# Patient Record
Sex: Female | Born: 1980 | Hispanic: No | Marital: Married | Smoking: Former smoker
Health system: Southern US, Community
[De-identification: ages and names within clinical notes are randomized; demographics above are authoritative.]

## PROBLEM LIST (undated history)

## (undated) DIAGNOSIS — G43909 Migraine, unspecified, not intractable, without status migrainosus: Secondary | ICD-10-CM

## (undated) DIAGNOSIS — G479 Sleep disorder, unspecified: Secondary | ICD-10-CM

## (undated) DIAGNOSIS — S129XXA Fracture of neck, unspecified, initial encounter: Secondary | ICD-10-CM

## (undated) HISTORY — PX: CERVICAL SPINE SURGERY: SHX589

## (undated) HISTORY — DX: Migraine, unspecified, not intractable, without status migrainosus: G43.909

## (undated) HISTORY — PX: TONSILLECTOMY: SUR1361

## (undated) HISTORY — DX: Sleep disorder, unspecified: G47.9

## (undated) HISTORY — PX: WISDOM TOOTH EXTRACTION: SHX21

---
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
## 2005-03-28 ENCOUNTER — Inpatient Hospital Stay: Payer: Self-pay

## 2010-05-13 DIAGNOSIS — S129XXA Fracture of neck, unspecified, initial encounter: Secondary | ICD-10-CM

## 2010-05-13 HISTORY — DX: Fracture of neck, unspecified, initial encounter: S12.9XXA

## 2012-04-03 ENCOUNTER — Other Ambulatory Visit: Payer: Self-pay | Admitting: Neurosurgery

## 2012-04-03 ENCOUNTER — Encounter: Payer: Self-pay | Admitting: Physical Medicine & Rehabilitation

## 2012-04-03 DIAGNOSIS — M542 Cervicalgia: Secondary | ICD-10-CM

## 2012-04-03 DIAGNOSIS — M5410 Radiculopathy, site unspecified: Secondary | ICD-10-CM

## 2012-04-08 ENCOUNTER — Other Ambulatory Visit: Payer: Self-pay | Admitting: Neurosurgery

## 2012-04-08 ENCOUNTER — Ambulatory Visit
Admission: RE | Admit: 2012-04-08 | Discharge: 2012-04-08 | Disposition: A | Discharge status: Home / Self Care | Payer: Self-pay | Source: Ambulatory Visit | Attending: Neurosurgery | Admitting: Neurosurgery

## 2012-04-08 ENCOUNTER — Inpatient Hospital Stay
Admission: RE | Admit: 2012-04-08 | Discharge: 2012-04-08 | Disposition: A | Discharge status: Home / Self Care | Payer: Self-pay | Source: Ambulatory Visit | Attending: Neurosurgery | Admitting: Neurosurgery

## 2012-04-08 VITALS — BP 109/66 | HR 68 | Ht 67.0 in | Wt 140.0 lb

## 2012-04-08 DIAGNOSIS — M542 Cervicalgia: Secondary | ICD-10-CM

## 2012-04-08 DIAGNOSIS — M5410 Radiculopathy, site unspecified: Secondary | ICD-10-CM

## 2012-04-08 MED ORDER — MEPERIDINE HCL 100 MG/ML IJ SOLN
75.0000 mg | Freq: Once | INTRAMUSCULAR | Status: AC
  Administered 2012-04-08: 75 mg via INTRAMUSCULAR

## 2012-04-08 MED ORDER — ONDANSETRON HCL 4 MG/2ML IJ SOLN
4.0000 mg | Freq: Once | INTRAMUSCULAR | Status: AC
  Administered 2012-04-08: 4 mg via INTRAMUSCULAR

## 2012-04-08 MED ORDER — DIAZEPAM 5 MG PO TABS
5.0000 mg | ORAL_TABLET | Freq: Once | ORAL | Status: AC
  Administered 2012-04-08: 5 mg via ORAL

## 2012-04-08 MED ORDER — IOHEXOL 300 MG/ML  SOLN
10.0000 mL | Freq: Once | INTRAMUSCULAR | Status: AC | PRN
  Administered 2012-04-08: 10 mL via INTRATHECAL

## 2012-04-08 NOTE — Progress Notes (Signed)
Pt states she has been off ultram since Sunday. Discharge instructions explained.  dd

## 2012-04-10 ENCOUNTER — Ambulatory Visit
Admission: RE | Admit: 2012-04-10 | Discharge: 2012-04-10 | Disposition: A | Discharge status: Home / Self Care | Payer: BC Managed Care – PPO | Source: Ambulatory Visit | Attending: Neurosurgery | Admitting: Neurosurgery

## 2012-04-10 ENCOUNTER — Other Ambulatory Visit: Payer: Self-pay | Admitting: Neurosurgery

## 2012-04-10 DIAGNOSIS — G971 Other reaction to spinal and lumbar puncture: Secondary | ICD-10-CM

## 2012-04-10 MED ORDER — IOHEXOL 180 MG/ML  SOLN
1.0000 mL | Freq: Once | INTRAMUSCULAR | Status: AC | PRN
  Administered 2012-04-10: 1 mL via EPIDURAL

## 2012-04-10 NOTE — Progress Notes (Signed)
20cc blood drawn from left AC space without difficulty for epidural blood patch.  jkl 

## 2012-04-17 ENCOUNTER — Ambulatory Visit (HOSPITAL_BASED_OUTPATIENT_CLINIC_OR_DEPARTMENT_OTHER): Payer: BC Managed Care – PPO | Admitting: Physical Medicine & Rehabilitation

## 2012-04-17 ENCOUNTER — Encounter: Payer: Self-pay | Admitting: Physical Medicine & Rehabilitation

## 2012-04-17 ENCOUNTER — Encounter: Payer: BC Managed Care – PPO | Attending: Physical Medicine & Rehabilitation

## 2012-04-17 VITALS — BP 115/62 | HR 91 | Resp 14 | Ht 67.0 in | Wt 141.0 lb

## 2012-04-17 DIAGNOSIS — M25529 Pain in unspecified elbow: Secondary | ICD-10-CM | POA: Insufficient documentation

## 2012-04-17 DIAGNOSIS — M542 Cervicalgia: Secondary | ICD-10-CM | POA: Insufficient documentation

## 2012-04-17 DIAGNOSIS — R209 Unspecified disturbances of skin sensation: Secondary | ICD-10-CM

## 2012-04-17 DIAGNOSIS — M25539 Pain in unspecified wrist: Secondary | ICD-10-CM | POA: Insufficient documentation

## 2012-04-17 NOTE — Progress Notes (Signed)
EMG performed 04/17/2012.  See EMG report under media tab.

## 2012-04-17 NOTE — Patient Instructions (Signed)
No evidence of carpal tunnel syndrome No evidence of ulnar neuropathy No electrodiagnostic evidence of cervical radiculopathy affecting the motor nerves

## 2012-05-08 ENCOUNTER — Ambulatory Visit: Payer: BC Managed Care – PPO | Admitting: Physical Medicine & Rehabilitation

## 2016-09-23 DIAGNOSIS — M542 Cervicalgia: Secondary | ICD-10-CM | POA: Diagnosis not present

## 2016-09-23 DIAGNOSIS — M5412 Radiculopathy, cervical region: Secondary | ICD-10-CM | POA: Diagnosis not present

## 2016-09-23 DIAGNOSIS — R51 Headache: Secondary | ICD-10-CM | POA: Diagnosis not present

## 2017-03-17 DIAGNOSIS — R51 Headache: Secondary | ICD-10-CM | POA: Diagnosis not present

## 2017-03-17 DIAGNOSIS — M5412 Radiculopathy, cervical region: Secondary | ICD-10-CM | POA: Diagnosis not present

## 2017-03-17 DIAGNOSIS — Z6828 Body mass index (BMI) 28.0-28.9, adult: Secondary | ICD-10-CM | POA: Diagnosis not present

## 2017-03-17 DIAGNOSIS — M542 Cervicalgia: Secondary | ICD-10-CM | POA: Diagnosis not present

## 2017-04-24 DIAGNOSIS — Z23 Encounter for immunization: Secondary | ICD-10-CM | POA: Diagnosis not present

## 2017-05-12 DIAGNOSIS — Z0000 Encounter for general adult medical examination without abnormal findings: Secondary | ICD-10-CM | POA: Diagnosis not present

## 2017-09-29 DIAGNOSIS — Z6828 Body mass index (BMI) 28.0-28.9, adult: Secondary | ICD-10-CM | POA: Diagnosis not present

## 2017-09-29 DIAGNOSIS — R51 Headache: Secondary | ICD-10-CM | POA: Diagnosis not present

## 2017-09-29 DIAGNOSIS — M542 Cervicalgia: Secondary | ICD-10-CM | POA: Diagnosis not present

## 2017-09-29 DIAGNOSIS — M5412 Radiculopathy, cervical region: Secondary | ICD-10-CM | POA: Diagnosis not present

## 2018-03-23 DIAGNOSIS — Z23 Encounter for immunization: Secondary | ICD-10-CM | POA: Diagnosis not present

## 2018-03-30 DIAGNOSIS — R51 Headache: Secondary | ICD-10-CM | POA: Diagnosis not present

## 2018-03-30 DIAGNOSIS — Z6826 Body mass index (BMI) 26.0-26.9, adult: Secondary | ICD-10-CM | POA: Diagnosis not present

## 2018-03-30 DIAGNOSIS — M5412 Radiculopathy, cervical region: Secondary | ICD-10-CM | POA: Diagnosis not present

## 2018-03-30 DIAGNOSIS — M542 Cervicalgia: Secondary | ICD-10-CM | POA: Diagnosis not present

## 2018-05-15 DIAGNOSIS — G479 Sleep disorder, unspecified: Secondary | ICD-10-CM | POA: Diagnosis not present

## 2018-05-15 DIAGNOSIS — Z0000 Encounter for general adult medical examination without abnormal findings: Secondary | ICD-10-CM | POA: Diagnosis not present

## 2018-09-17 DIAGNOSIS — R51 Headache: Secondary | ICD-10-CM | POA: Diagnosis not present

## 2018-09-17 DIAGNOSIS — M542 Cervicalgia: Secondary | ICD-10-CM | POA: Diagnosis not present

## 2018-10-30 ENCOUNTER — Telehealth: Payer: Self-pay | Admitting: Certified Nurse Midwife

## 2018-10-30 NOTE — Telephone Encounter (Signed)
Patent coming in on 01/15/19 at 9:50 to see [facility] for mirena removal and reinsertion.   Thanks

## 2018-11-02 NOTE — Telephone Encounter (Signed)
Noted. Will order to arrive by apt date/time. 

## 2019-01-15 ENCOUNTER — Other Ambulatory Visit: Payer: Self-pay

## 2019-01-15 ENCOUNTER — Encounter: Payer: Self-pay | Admitting: Certified Nurse Midwife

## 2019-01-15 ENCOUNTER — Ambulatory Visit (INDEPENDENT_AMBULATORY_CARE_PROVIDER_SITE_OTHER): Payer: BC Managed Care – PPO | Admitting: Certified Nurse Midwife

## 2019-01-15 VITALS — BP 102/84 | HR 84 | Ht 67.75 in | Wt 169.0 lb

## 2019-01-15 DIAGNOSIS — Z23 Encounter for immunization: Secondary | ICD-10-CM

## 2019-01-15 DIAGNOSIS — Z30433 Encounter for removal and reinsertion of intrauterine contraceptive device: Secondary | ICD-10-CM

## 2019-01-17 ENCOUNTER — Encounter: Payer: Self-pay | Admitting: Certified Nurse Midwife

## 2019-01-17 DIAGNOSIS — G43909 Migraine, unspecified, not intractable, without status migrainosus: Secondary | ICD-10-CM | POA: Insufficient documentation

## 2019-01-17 DIAGNOSIS — G479 Sleep disorder, unspecified: Secondary | ICD-10-CM | POA: Insufficient documentation

## 2019-01-17 MED ORDER — LEVONORGESTREL 20 MCG/24HR IU IUD: INTRAUTERINE_SYSTEM | Freq: Once | INTRAUTERINE | Status: DC
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
## 2019-02-17 ENCOUNTER — Encounter: Payer: Self-pay | Admitting: Certified Nurse Midwife

## 2019-02-17 ENCOUNTER — Ambulatory Visit (INDEPENDENT_AMBULATORY_CARE_PROVIDER_SITE_OTHER): Payer: BC Managed Care – PPO | Admitting: Certified Nurse Midwife

## 2019-02-17 ENCOUNTER — Other Ambulatory Visit: Payer: Self-pay

## 2019-02-17 ENCOUNTER — Other Ambulatory Visit (HOSPITAL_COMMUNITY)
Admission: RE | Admit: 2019-02-17 | Discharge: 2019-02-17 | Disposition: A | Discharge status: Home / Self Care | Payer: BC Managed Care – PPO | Source: Ambulatory Visit | Attending: Certified Nurse Midwife | Admitting: Certified Nurse Midwife

## 2019-02-17 VITALS — BP 100/70 | HR 71 | Ht 67.0 in | Wt 179.0 lb

## 2019-02-17 DIAGNOSIS — Z124 Encounter for screening for malignant neoplasm of cervix: Secondary | ICD-10-CM | POA: Insufficient documentation

## 2019-02-17 DIAGNOSIS — Z30431 Encounter for routine checking of intrauterine contraceptive device: Secondary | ICD-10-CM

## 2019-02-17 DIAGNOSIS — Z01419 Encounter for gynecological examination (general) (routine) without abnormal findings: Secondary | ICD-10-CM

## 2019-02-17 DIAGNOSIS — Z1239 Encounter for other screening for malignant neoplasm of breast: Secondary | ICD-10-CM
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
## 2019-03-01 LAB — CYTOLOGY - PAP
Comment: NEGATIVE
Diagnosis: NEGATIVE
High risk HPV: NEGATIVE

## 2019-03-15 DIAGNOSIS — M542 Cervicalgia: Secondary | ICD-10-CM | POA: Diagnosis not present

## 2019-03-15 DIAGNOSIS — Z6827 Body mass index (BMI) 27.0-27.9, adult: Secondary | ICD-10-CM | POA: Diagnosis not present

## 2019-03-15 DIAGNOSIS — M5412 Radiculopathy, cervical region: Secondary | ICD-10-CM | POA: Diagnosis not present

## 2019-03-15 DIAGNOSIS — R519 Headache, unspecified: Secondary | ICD-10-CM | POA: Diagnosis not present

## 2019-11-24 ENCOUNTER — Ambulatory Visit (INDEPENDENT_AMBULATORY_CARE_PROVIDER_SITE_OTHER): Payer: BC Managed Care – PPO | Admitting: Certified Nurse Midwife

## 2019-11-24 ENCOUNTER — Encounter: Payer: Self-pay | Admitting: Certified Nurse Midwife

## 2019-11-24 ENCOUNTER — Other Ambulatory Visit: Payer: Self-pay

## 2019-11-24 VITALS — BP 114/70 | HR 72 | Resp 18 | Ht 67.0 in | Wt 166.0 lb

## 2019-11-24 DIAGNOSIS — Z30432 Encounter for removal of intrauterine contraceptive device: Secondary | ICD-10-CM | POA: Diagnosis not present

## 2019-11-24 DIAGNOSIS — Z3169 Encounter for other general counseling and advice on procreation: Secondary | ICD-10-CM | POA: Diagnosis not present

## 2019-11-24 NOTE — Progress Notes (Signed)
  History of Present Illness:  Crystal Moreno is a 39 y.o. that had a Mirena IUD replaced approximately 10 months ago. Since that time, she states that she and her husband have decided to have another baby. She has stopped taking all medication except for fish oil, tumeric, melatonin and prenatal vitamins.    Patient Active Problem List   Diagnosis Date Noted  . Migraines 01/17/2019  . Sleep disorder 01/17/2019   Medications:  Current Outpatient Medications on File Prior to Visit  Medication Sig Dispense Refill  . Omega-3 Fatty Acids (FISH OIL) 1000 MG CAPS Take by mouth.    . Turmeric (QC TUMERIC COMPLEX PO) Take by mouth.     Current Facility-Administered Medications on File Prior to Visit  Medication Dose Route Frequency Provider Last Rate Last Admin  . levonorgestrel (MIRENA) 20 MCG/24HR IUD   Intrauterine Once Farrel Conners, CNM       Allergies: is allergic to lyrica [pregabalin] and codeine.  Physical Exam:  BP 114/70   Pulse 72   Resp 18   Ht 5\' 7"  (1.702 m)   Wt 166 lb (75.3 kg)   SpO2 99%   BMI 26.00 kg/m  Body mass index is 26 kg/m. Constitutional: Well nourished, well developed female in no acute distress. Neuro: Grossly intact Psych:  Normal mood and affect.    Pelvic exam:  Two IUD strings present seen coming from the cervical os. EGBUS, vaginal vault and cervix: within normal limits  IUD Removal Strings of IUD identified and grasped.  IUD removed without problem.  Pt tolerated this well.  IUD noted to be intact.  Assessment: IUD Removal  Plan: IUD removed and plan for contraception is no method as she desires to conceive. Discussed increased risks of chromosomal anomalies, preeclampsia and GDM with AMA Continue prenatal vitamins Seek help from a fertility specialist if not pregnant in the next 6 months. She was amenable to this  , CNM 11/24/2019 9:24 AM

## 2019-11-28 ENCOUNTER — Encounter: Payer: Self-pay | Admitting: Certified Nurse Midwife

## 2020-01-04 ENCOUNTER — Encounter: Payer: Self-pay | Admitting: Obstetrics and Gynecology

## 2020-01-04 ENCOUNTER — Ambulatory Visit (INDEPENDENT_AMBULATORY_CARE_PROVIDER_SITE_OTHER): Payer: BC Managed Care – PPO | Admitting: Obstetrics and Gynecology

## 2020-01-04 ENCOUNTER — Other Ambulatory Visit: Payer: Self-pay

## 2020-01-04 VITALS — BP 110/80 | Ht 68.0 in | Wt 165.0 lb

## 2020-01-04 DIAGNOSIS — R102 Pelvic and perineal pain: Secondary | ICD-10-CM

## 2020-01-04 DIAGNOSIS — N926 Irregular menstruation, unspecified: Secondary | ICD-10-CM | POA: Diagnosis not present

## 2020-01-04 NOTE — Patient Instructions (Signed)
I value your feedback and entrusting us with your care. If you get a [facility] patient survey, I would appreciate you taking the time to let us know about your experience today. Thank you!  As of April 22, 2019, your lab results will be released to your MyChart immediately, before I even have a chance to see them. Please give me time to review them and contact you if there are any abnormalities. Thank you for your patience.  
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
## 2020-02-20 ENCOUNTER — Encounter (HOSPITAL_COMMUNITY): Payer: Self-pay | Admitting: Emergency Medicine

## 2020-02-20 ENCOUNTER — Emergency Department (HOSPITAL_COMMUNITY): Payer: BC Managed Care – PPO

## 2020-02-20 ENCOUNTER — Other Ambulatory Visit: Payer: Self-pay

## 2020-02-20 DIAGNOSIS — Z87891 Personal history of nicotine dependence: Secondary | ICD-10-CM | POA: Diagnosis not present

## 2020-02-20 DIAGNOSIS — S93401A Sprain of unspecified ligament of right ankle, initial encounter: Secondary | ICD-10-CM | POA: Diagnosis not present

## 2020-02-20 DIAGNOSIS — W108XXA Fall (on) (from) other stairs and steps, initial encounter: Secondary | ICD-10-CM | POA: Insufficient documentation

## 2020-02-20 DIAGNOSIS — S99911A Unspecified injury of right ankle, initial encounter: Secondary | ICD-10-CM | POA: Diagnosis present

## 2020-02-20 LAB — POCT PREGNANCY, URINE: Preg Test, Ur: NEGATIVE

## 2020-02-20 NOTE — ED Triage Notes (Signed)
Pt c/o right ankle pain after falling tonight.

## 2020-02-21 ENCOUNTER — Emergency Department (HOSPITAL_COMMUNITY)
Admission: EM | Admit: 2020-02-21 | Discharge: 2020-02-21 | Disposition: A | Discharge status: Home / Self Care | Payer: BC Managed Care – PPO | Attending: Emergency Medicine | Admitting: Emergency Medicine

## 2020-02-21 DIAGNOSIS — S93401A Sprain of unspecified ligament of right ankle, initial encounter: Secondary | ICD-10-CM
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
## 2020-02-21 NOTE — Discharge Instructions (Addendum)
Apply ice for 30 minutes at a time, 4 times a day.  Keep your foot elevated as much as possible.  Wear the ankle splint orthotic as needed.  Use crutches as needed.  Take acetaminophen and/or ibuprofen as needed for pain.

## 2020-03-30 ENCOUNTER — Ambulatory Visit (INDEPENDENT_AMBULATORY_CARE_PROVIDER_SITE_OTHER): Payer: BC Managed Care – PPO | Admitting: Obstetrics and Gynecology

## 2020-03-30 ENCOUNTER — Other Ambulatory Visit: Payer: Self-pay | Admitting: Obstetrics and Gynecology

## 2020-03-30 ENCOUNTER — Other Ambulatory Visit: Payer: Self-pay

## 2020-03-30 ENCOUNTER — Encounter: Payer: Self-pay | Admitting: Obstetrics and Gynecology

## 2020-03-30 ENCOUNTER — Other Ambulatory Visit (HOSPITAL_COMMUNITY)
Admission: RE | Admit: 2020-03-30 | Discharge: 2020-03-30 | Disposition: A | Discharge status: Home / Self Care | Payer: BC Managed Care – PPO | Source: Ambulatory Visit | Attending: Obstetrics and Gynecology | Admitting: Obstetrics and Gynecology

## 2020-03-30 ENCOUNTER — Ambulatory Visit
Admission: RE | Admit: 2020-03-30 | Discharge: 2020-03-30 | Disposition: A | Discharge status: Home / Self Care | Payer: BC Managed Care – PPO | Source: Ambulatory Visit | Attending: Obstetrics and Gynecology | Admitting: Obstetrics and Gynecology

## 2020-03-30 VITALS — BP 114/70 | Ht 67.0 in | Wt 175.4 lb

## 2020-03-30 DIAGNOSIS — Z3491 Encounter for supervision of normal pregnancy, unspecified, first trimester: Secondary | ICD-10-CM

## 2020-03-30 DIAGNOSIS — Z3A10 10 weeks gestation of pregnancy: Secondary | ICD-10-CM | POA: Diagnosis present

## 2020-03-30 DIAGNOSIS — O3680X0 Pregnancy with inconclusive fetal viability, not applicable or unspecified: Secondary | ICD-10-CM

## 2020-03-30 DIAGNOSIS — O0991 Supervision of high risk pregnancy, unspecified, first trimester: Secondary | ICD-10-CM | POA: Insufficient documentation

## 2020-03-30 DIAGNOSIS — N926 Irregular menstruation, unspecified: Secondary | ICD-10-CM

## 2020-03-30 DIAGNOSIS — O09521 Supervision of elderly multigravida, first trimester: Secondary | ICD-10-CM

## 2020-03-30 LAB — POCT URINE PREGNANCY: Preg Test, Ur: POSITIVE — AB
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
## 2020-03-30 NOTE — Progress Notes (Signed)
NOB 

## 2020-03-30 NOTE — Patient Instructions (Signed)
First Trimester of Pregnancy The first trimester of pregnancy is from week 1 until the end of week 13 (months 1 through 3). A week after a sperm fertilizes an egg, the egg will implant on the wall of the uterus. This embryo will begin to develop into a baby. Genes from you and your partner will form the baby. The female genes will determine whether the baby will be a boy or a girl. At 6-8 weeks, the eyes and face will be formed, and the heartbeat can be seen on ultrasound. At the end of 12 weeks, all the baby's organs will be formed. Now that you are pregnant, you will want to do everything you can to have a healthy baby. Two of the most important things are to get good prenatal care and to follow your health care provider's instructions. Prenatal care is all the medical care you receive before the baby's birth. This care will help prevent, find, and treat any problems during the pregnancy and childbirth. Body changes during your first trimester Your body goes through many changes during pregnancy. The changes vary from woman to woman.  You may gain or lose a couple of pounds at first.  You may feel sick to your stomach (nauseous) and you may throw up (vomit). If the vomiting is uncontrollable, call your health care provider.  You may tire easily.  You may develop headaches that can be relieved by medicines. All medicines should be approved by your health care provider.  You may urinate more often. Painful urination may mean you have a bladder infection.  You may develop heartburn as a result of your pregnancy.  You may develop constipation because certain hormones are causing the muscles that push stool through your intestines to slow down.  You may develop hemorrhoids or swollen veins (varicose veins).  Your breasts may begin to grow larger and become tender. Your nipples may stick out more, and the tissue that surrounds them (areola) may become darker.  Your gums may bleed and may be  sensitive to brushing and flossing.  Dark spots or blotches (chloasma, mask of pregnancy) may develop on your face. This will likely fade after the baby is born.  Your menstrual periods will stop.  You may have a loss of appetite.  You may develop cravings for certain kinds of food.  You may have changes in your emotions from day to day, such as being excited to be pregnant or being concerned that something may go wrong with the pregnancy and baby.  You may have more vivid and strange dreams.  You may have changes in your hair. These can include thickening of your hair, rapid growth, and changes in texture. Some women also have hair loss during or after pregnancy, or hair that feels dry or thin. Your hair will most likely return to normal after your baby is born. What to expect at prenatal visits During a routine prenatal visit:  You will be weighed to make sure you and the baby are growing normally.  Your blood pressure will be taken.  Your abdomen will be measured to track your baby's growth.  The fetal heartbeat will be listened to between weeks 10 and 14 of your pregnancy.  Test results from any previous visits will be discussed. Your health care provider may ask you:  How you are feeling.  If you are feeling the baby move.  If you have had any abnormal symptoms, such as leaking fluid, bleeding, severe headaches, or abdominal   cramping.  If you are using any tobacco products, including cigarettes, chewing tobacco, and electronic cigarettes.  If you have any questions. Other tests that may be performed during your first trimester include:  Blood tests to find your blood type and to check for the presence of any previous infections. The tests will also be used to check for low iron levels (anemia) and protein on red blood cells (Rh antibodies). Depending on your risk factors, or if you previously had diabetes during pregnancy, you may have tests to check for high blood sugar  that affects pregnant women (gestational diabetes).  Urine tests to check for infections, diabetes, or protein in the urine.  An ultrasound to confirm the proper growth and development of the baby.  Fetal screens for spinal cord problems (spina bifida) and Down syndrome.  HIV (human immunodeficiency virus) testing. Routine prenatal testing includes screening for HIV, unless you choose not to have this test.  You may need other tests to make sure you and the baby are doing well. Follow these instructions at home: Medicines  Follow your health care provider's instructions regarding medicine use. Specific medicines may be either safe or unsafe to take during pregnancy.  Take a prenatal vitamin that contains at least 600 micrograms (mcg) of folic acid.  If you develop constipation, try taking a stool softener if your health care provider approves. Eating and drinking   Eat a balanced diet that includes fresh fruits and vegetables, whole grains, good sources of protein such as meat, eggs, or tofu, and low-fat dairy. Your health care provider will help you determine the amount of weight gain that is right for you.  Avoid raw meat and uncooked cheese. These carry germs that can cause birth defects in the baby.  Eating four or five small meals rather than three large meals a day may help relieve nausea and vomiting. If you start to feel nauseous, eating a few soda crackers can be helpful. Drinking liquids between meals, instead of during meals, also seems to help ease nausea and vomiting.  Limit foods that are high in fat and processed sugars, such as fried and sweet foods.  To prevent constipation: ? Eat foods that are high in fiber, such as fresh fruits and vegetables, whole grains, and beans. ? Drink enough fluid to keep your urine clear or pale yellow. Activity  Exercise only as directed by your health care provider. Most women can continue their usual exercise routine during  pregnancy. Try to exercise for 30 minutes at least 5 days a week. Exercising will help you: ? Control your weight. ? Stay in shape. ? Be prepared for labor and delivery.  Experiencing pain or cramping in the lower abdomen or lower back is a good sign that you should stop exercising. Check with your health care provider before continuing with normal exercises.  Try to avoid standing for long periods of time. Move your legs often if you must stand in one place for a long time.  Avoid heavy lifting.  Wear low-heeled shoes and practice good posture.  You may continue to have sex unless your health care provider tells you not to. Relieving pain and discomfort  Wear a good support bra to relieve breast tenderness.  Take warm sitz baths to soothe any pain or discomfort caused by hemorrhoids. Use hemorrhoid cream if your health care provider approves.  Rest with your legs elevated if you have leg cramps or low back pain.  If you develop varicose veins in   your legs, wear support hose. Elevate your feet for 15 minutes, 3-4 times a day. Limit salt in your diet. Prenatal care  Schedule your prenatal visits by the twelfth week of pregnancy. They are usually scheduled monthly at first, then more often in the last 2 months before delivery.  Write down your questions. Take them to your prenatal visits.  Keep all your prenatal visits as told by your health care provider. This is important. Safety  Wear your seat belt at all times when driving.  Make a list of emergency phone numbers, including numbers for family, friends, the hospital, and police and fire departments. General instructions  Ask your health care provider for a referral to a local prenatal education class. Begin classes no later than the beginning of month 6 of your pregnancy.  Ask for help if you have counseling or nutritional needs during pregnancy. Your health care provider can offer advice or refer you to specialists for help  with various needs.  Do not use hot tubs, steam rooms, or saunas.  Do not douche or use tampons or scented sanitary pads.  Do not cross your legs for long periods of time.  Avoid cat litter boxes and soil used by cats. These carry germs that can cause birth defects in the baby and possibly loss of the fetus by miscarriage or stillbirth.  Avoid all smoking, herbs, alcohol, and medicines not prescribed by your health care provider. Chemicals in these products affect the formation and growth of the baby.  Do not use any products that contain nicotine or tobacco, such as cigarettes and e-cigarettes. If you need help quitting, ask your health care provider. You may receive counseling support and other resources to help you quit.  Schedule a dentist appointment. At home, brush your teeth with a soft toothbrush and be gentle when you floss. Contact a health care provider if:  You have dizziness.  You have mild pelvic cramps, pelvic pressure, or nagging pain in the abdominal area.  You have persistent nausea, vomiting, or diarrhea.  You have a bad smelling vaginal discharge.  You have pain when you urinate.  You notice increased swelling in your face, hands, legs, or ankles.  You are exposed to fifth disease or chickenpox.  You are exposed to German measles (rubella) and have never had it. Get help right away if:  You have a fever.  You are leaking fluid from your vagina.  You have spotting or bleeding from your vagina.  You have severe abdominal cramping or pain.  You have rapid weight gain or loss.  You vomit blood or material that looks like coffee grounds.  You develop a severe headache.  You have shortness of breath.  You have any kind of trauma, such as from a fall or a car accident. Summary  The first trimester of pregnancy is from week 1 until the end of week 13 (months 1 through 3).  Your body goes through many changes during pregnancy. The changes vary from  woman to woman.  You will have routine prenatal visits. During those visits, your health care provider will examine you, discuss any test results you may have, and talk with you about how you are feeling. This information is not intended to replace advice given to you by your health care provider. Make sure you discuss any questions you have with your health care provider. Document Revised: 04/11/2017 Document Reviewed: 04/10/2016 [employer] Patient Education  2020 [employer]  

## 2020-03-31 LAB — RPR+RH+ABO+RUB AB+AB SCR+CB...
Antibody Screen: NEGATIVE
HIV Screen 4th Generation wRfx: NONREACTIVE
Hematocrit: 41.3 % (ref 34.0–46.6)
Hemoglobin: 13.8 g/dL (ref 11.1–15.9)
Hepatitis B Surface Ag: NEGATIVE
MCH: 28.9 pg (ref 26.6–33.0)
MCHC: 33.4 g/dL (ref 31.5–35.7)
MCV: 87 fL (ref 79–97)
Platelets: 202 10*3/uL (ref 150–450)
RBC: 4.77 x10E6/uL (ref 3.77–5.28)
RDW: 12.8 % (ref 11.7–15.4)
RPR Ser Ql: NONREACTIVE
Rh Factor: POSITIVE
Rubella Antibodies, IGG: 1.4 index (ref >0.99)
Varicella zoster IgG: 1531 index (ref >165)
WBC: 7.9 10*3/uL (ref 3.4–10.8)

## 2020-04-01 LAB — URINE CULTURE

## 2020-04-03 LAB — CERVICOVAGINAL ANCILLARY ONLY
Chlamydia: NEGATIVE
Comment: NEGATIVE
Comment: NORMAL
Neisseria Gonorrhea: NEGATIVE

## 2020-04-04 LAB — URINE DRUG PANEL 7
Amphetamines, Urine: NEGATIVE ng/mL
Barbiturate Quant, Ur: NEGATIVE ng/mL
Benzodiazepine Quant, Ur: NEGATIVE ng/mL
Cannabinoid Quant, Ur: NEGATIVE ng/mL
Cocaine (Metab.): NEGATIVE ng/mL
Opiate Quant, Ur: NEGATIVE ng/mL
PCP Quant, Ur: NEGATIVE ng/mL

## 2020-04-14 ENCOUNTER — Other Ambulatory Visit: Payer: Self-pay | Admitting: Obstetrics and Gynecology

## 2020-04-14 ENCOUNTER — Ambulatory Visit (INDEPENDENT_AMBULATORY_CARE_PROVIDER_SITE_OTHER): Payer: BC Managed Care – PPO | Admitting: Obstetrics and Gynecology

## 2020-04-14 ENCOUNTER — Other Ambulatory Visit: Payer: Self-pay

## 2020-04-14 ENCOUNTER — Encounter: Payer: BC Managed Care – PPO | Admitting: Advanced Practice Midwife

## 2020-04-14 ENCOUNTER — Ambulatory Visit (INDEPENDENT_AMBULATORY_CARE_PROVIDER_SITE_OTHER): Payer: BC Managed Care – PPO

## 2020-04-14 VITALS — BP 110/70 | Ht 67.0 in | Wt 176.4 lb

## 2020-04-14 DIAGNOSIS — Z3A11 11 weeks gestation of pregnancy: Secondary | ICD-10-CM

## 2020-04-14 DIAGNOSIS — O0991 Supervision of high risk pregnancy, unspecified, first trimester: Secondary | ICD-10-CM

## 2020-04-14 DIAGNOSIS — O021 Missed abortion: Secondary | ICD-10-CM

## 2020-04-14 LAB — POCT URINALYSIS DIPSTICK OB
Glucose, UA: NEGATIVE
POC,PROTEIN,UA: NEGATIVE

## 2020-04-14 MED ORDER — MISOPROSTOL 200 MCG PO TABS: ORAL_TABLET | ORAL | 0 refills | Status: AC

## 2020-04-14 NOTE — Progress Notes (Signed)
ROB/US  

## 2020-04-14 NOTE — Patient Instructions (Addendum)
Medical Miscarriage Management - Take Home Instructions  How to Prepare . Insert 4 misoprostol tablets as directed by your doctor. o Buccal: Rinse your mouth out with water. Place 2 misoprostol tablets between your gums and cheek on each side (4 tablets total). Let the tablets dissolve. After 1 hour, swallow the tablet parts that are left with a sip of water. Do not eat, drink, or chew gum while the tablets are dissolving. OR o Vaginal: Moisten the 4 tablets with a little bit of water and use your finger to insert them as high up into the vagina as possible. Sit or lie down for 30 minutes after placing the tablets so they don't fall out.  . Because the medication will cause you to have bleeding and cramping (and possibly nausea or diarrhea), arrange for the following: o Time off from work o Child care for the day o A support person o Heating pad or hot water bottle o Fluids and light foods (water, ginger ale, broth, jell-o, crackers, etc.) o Menstrual/ period pads  What to Expect  Bleeding Most women will have bleeding that starts within 1-4 hours of taking the misoprostol. You should expect bleeding that is heavier than your normal period. You may pass blood clots (some clots may even be the size of lemons or oranges). The bleeding should be heaviest during the first several hours and should then decrease. There is a very small risk of excessive bleeding, which may require you to have a surgical procedure (D&C) or receive other medical treatment. If you do not have any bleeding within 12 hours of taking the misoprostol, please call us.  Cramping It is normal to have cramping. More severe lower abdominal cramping may last for several hours when the pregnancy tissue is passing out of the body. After the pregnancy passes, the cramping should decrease. Take pain medications as prescribed and use a heating pad or hot water bottle as needed.  Pregnancy tissue You may pass the  pregnancy tissue at an unexpected time or place. Most women will not see an embryo pass (a 7 week embryo is smaller than a grain of rice). You may see a blood clot or a small white clump of tissue.  Other side effects Fatigue, nausea, stomach upset, and diarrhea are common. These symptoms should go away within a few days.  Menstrual cycle (periods) After the initial bleeding, you may continue to have light bleeding or spotting for several weeks. Your first period may be lighter or heavier than usual. Your periods may be irregular for the first few months.  Contraception (Water quality scientist) Most women are able to become pregnant again very quickly after a miscarriage, usually before they have had their next menstrual period. If desired, you can start your birth control method the day after you take the pills (or as directed by your doctor).  Exercise & Rest Most women prefer to take it easy for a few days after a miscarriage. You should not exercise strenuously for the first week (running, tough work-outs, etc.), as cramping or bleeding may happen. Let your body be your guide.  Feelings Women experience a variety of emotions when they are pregnant and when they have a miscarriage. Some of these feelings may be stronger because of the hormonal changes. If you are feeling consistently sad or distressed, please contact us.  Things to Avoid . Avoid aspirin and alcohol until your follow-up appointment, because these may increase you're bleeding and worsen stomach upset. . Do not  use tampons for 2 weeks and until seen for follow-up (use pads instead). . Don't have sex for at least 2 weeks and until seen for follow-up. . Don't soak in a tub bath or spa, or go swimming for 2 weeks (showers ok). Follow-Up You must be seen for a follow-up appointment and ultrasound in 2 weeks (or as directed by your doctor). This is the only way to confirm that the pregnancy has passed completely. Contact  us . Contact us right away if you have: o Bleeding that is more than 2 completely soaked pads per hour for more than 2 hours in a row. o Persistent feelings that you might pass out or faint. o Pain that is uncontrolled with medications, rest, and heating pads. o A fever higher than 100.67F. o Severe vomiting or diarrhea lasting more than 4-6 hours. o Symptoms of an allergic reaction (rash, shortness of breath). . If you are having an emergency, please go to an emergency room.     [facility]  MJ Tucci [phone]   Please accept our heartfelt condolences and call us if you need an ear to listen.  Web Resources: www.[email or web address] www.acog.org [email or web address] [email or web address] [email or web address] This site has an excellent pamphlet that you can download on early pregnancy loss.  Books: Miscarriage: Women Sharing from the Heart by Lorre Munroe and Ellyn Hack. Published by [employer]. Summarizes reactions of 100 women who experience miscarriage.  Molly's [place] by Baldemar Lenis. Published by [employer]. Children's book on miscarriage.  Miscarriage: A Man's Book by Philippa Sicks. Published by [employer] at [phone]. Written to help men understand reactions of both parents to miscarriage.  Miscarriage: A Shattered Dream by Peri Maris and Lubertha Sayres.  I Never Held You: A book about miscarriage, healing and recovery by Armen Pickup. [place].   MISCARRIAGE OR NEONATAL LOSS.a word to parents By Carmie End, MSW  For those who experience a miscarriage or fetal loss, the world has turned upside down. Regardless of whether the pregnancy was planned and wanted, or unplanned and the parents were ambivalent, there are feelings of sadness, grief and a loss of equilibrium which effects both parents. Though we allow for these feelings when other deaths occur, we are remiss in acknowledging and allowing  parents to grieve their pregnancy loss or miscarriage. We wrongly equate the depth of the loss to the size of the casket.  With pregnancy loss or death before birth, there is little guidance or expectation of what is normal grief. Others, who minimize or misunderstand the loss, may expect that you "get back to normal" quickly. With pressures to overlook the impact of the loss, healthy grieving processes can get stunted, overlooked, or stopped all together. I hope that this handout will allow you to acknowledge and express whatever loss you feel, to share with others your sorrow and will encourage you and others to take a moment to understand and experience the depth of your emotions at this time.  A baby represents so much. It may represent hope, immortality, fulfillment of dreams, or an outward sign of a loving relationship. At the same time, pregnancy may bring on anxiety, fear, and an increase in commitment and responsibility. Understandably, few pregnant parents experience only one or the other of these emotions. Despite the type of emotional response to the pregnancy, as the process continues, what is central to almost all pregnancies is that over time, the bond between parent and child grows. Each day brings an  expansion of the world. What was once routine now becomes more important. Diet, lifestyle, connection to family, time commitments, relationships with friends, spouse, others.nothing is as it was. These are the normal changes that a pregnant woman and her partner experience. When a baby dies, nothing that made sense before makes any sense after. We are left emotionally raw. Often our bodies respond in ways that assure [place] that we are crazy, but we are not. We are grieving a life that we never fully knew, a relationship that was too short. A part of [place] has died Too.  Mothers and fathers become more attached to a pregnancy as each day, week and month go by. Mothers have physical  contact with the pregnancy, daily reminders of the growth and change occurring. They connect by talking to their bellies, by wondering what life will be like this time next year, look for clothes, cribs, pediatricians and more. Fathers often become attached in different ways: considering buying a new car or safe car seat, working more to earn a little more money before the baby arrives, or by reconsidering the family's financial status. However bonding occurs, it generally increases as the baby grows. For family members, co-workers and friends, the pregnancy may be less real. They may only know of the pregnancy from a physical or emotional distance. Since many of [place] are so uncomfortable with death and we work hard to deny it. With a prenatal loss, the lack of contact with the unborn baby may encourage others to minimize the loss, or even suggest that it was something to feel thankful for at this time suggesting that the pain felt now would be significantly less than at any other time in relationship to this child.  That is why it feels so crazy to be in such pain when others might not understand. You might feel that you are still standing at an emotional graveside and the world wonders "when you'll be ready to go back to work" or "why you still cry. It's been four months already." Be assured that it is the rest of the world that is, in this case, acting crazy. When a living child or adult dies, we have understandings, behaviors, rituals that though painful, enable [place] to feel cared for, comforted, acknowledged in our loss, and encourage healing. Unfortunately, too little of this is the case when there is a miscarriage or neonatal loss.  There are many difficult dilemmas that you might face in the wake of the loss. Do you have a funeral? How do you tell people? Do you publish an obituary? What do you do with the remains, both physical and emotional? I encourage people to find a formalized way  to acknowledge the loss of the pregnancy and to honor the relationship that did and does still exist and will for the rest of your life. It is your choice as to whether this happens in a funeral service, in a [employer], in a letter to the child who you did not meet or in some manner that makes sense to you religiously, spiritually or emotionally. It is not silly to ask friends or family to participate nor is it improper to keep this intensely personal and private. Grief, however, needs to be a public process. We must grieve among others and with our loved ones. We do not all need to feel the same amount of pain in the loss, but we need to act as a loving community to support those most hurt at  this time. Healthy grieving involves a strengthening of the bonds with others who we love and who love [place]. Some people ask a close friend or relative to contact others and inform them of the loss to minimize the telling of the story. Others find relief in telling friends, family and colleagues about the loss, in that it reminds [place] that the pregnancy was real and that this pain is real. Where one connection is broken, another is reinforced and strengthened. Grieving in isolation can be detrimental. By inviting important others to a funeral, tree planting, or sunset service and by taking them up on an offer to help will help your healing.  Some people have described aspects of grief after a pregnancy loss that are quite different from those present after the loss of an adult relative or friend. Some things that might seem "weird" or "abnormal" are indeed, quite normal following this kind of loss. Alberteen Spindle described her experience in the book "Understanding: Death of the Wished-for Child." "I kept searching for something. I wasn't sure what it was. All of a sudden one day in the kitchen, I spontaneously got out my kitchen scales and started weighing fruits and vegetables. I realized I was trying  to find something that had the identical weight that the baby did.I found myself weighing my rolling pin.it happened to be the identical length and weight that the baby was." This describes the process of building memories. With miscarriage and neonatal loss, there are too few memories of the pregnancy or the baby itself. Parents may not ever be able to see or hold a baby in these cases, especially in the earlier trimesters, so saving mementos and gathering and providing information are especially important. Alberteen Spindle described a parent who needs to see and feel something that had similar dimensions as the child. This is profoundly different than the grieving that happens when a 40 year old person dies, when we have years of memories, concrete possessions, photos and history. Though it is a different aspect of grief, it is normal, expected and healthy. Your grief will not make you crazy.  Also, do not be frightened if you feel this loss in a very physical manner. Your arms or chest may ache, convincing you that your heart has broken. Women have described this as a feeling of "empty arms". They also describe a similar feeling of an emptiness in their bellies, which was previously full. Some women also swear that they still feel the baby moving in their bellies. Many people report being woken to the sound of a crying baby, having vivid dreams about babies, trying to find their lost babies, or graphic dreams of injuries happening to themselves or others. Breasts may still feel as if they are filling and it may seem as if the body is unaware of the loss. These things can feel quite confusing. If you experience any of these responses, please tell someone about them. They are not a sign of insanity, they are aspects of grief. Keeping them to yourself increases the feelings of isolation and disequilibrium. Be reassured that these are normal experiences after the loss of a pregnancy.   A WORD  ABOUT DIFFERENCES BETWEEN MEN'S AND WOMEN'S GRIEF  If most people do not fully understand the pain of a mother's grief through pregnancy loss, there is an even greater denial on society's part that the father is in pain. I have seen men in great pain and despair after the loss of a pregnancy  and they say that nobody has ever asked them about their loss. People may only ask about their wife/partner's pain. To put it simply, one father, head bowed, shoulders heaving as he cried said "if one more person asks me how Erskine Squibb is, I'll fly apart. Don't they know I lost a child too? Don't they see the circles under my eyes, my face, my pain? Don't I count?" Couples often move through the grief process in different ways and at different times. Do not assume that your partner is better because they are wanting to take in a movie or go out to eat. This may just be a diversion, a need for a change of scenery, and a yearning for normalcy. Women are usually more comfortable crying and men find safety in action. For this reason, friction can develop between partners, when one wants the other to start behaving like they did before the loss or to cry along with them. Respect one another's process. Talk to your partner about the specifics. If asked, "How are you today?" many people will just say "fine". Specifically relate that you have had a hard time with the holiday, or seeing a friend's new baby, and ask your partner how they felt in that moment. Sex is very often a very difficult prospect after a miscarriage or loss. It might remind a person of the conception, raise fears of another pregnancy and future children, or seem like too much pleasure to experience when one is otherwise in pain. Many men reconnect with their partner by being sexual and feel increasingly isolated when a partner does not respond. Talk. There may be a comfortable middle ground with each other. If the general patterns of the  relationship shift toward uncomfortable dynamics, such as decreased communication, blaming one another, or increased absences from the home, it is important to seek counseling with a professional. This is not an easy time, you have had a loss. Be patient with yourself. Seek help and support from loved ones, friends, professionals. The grief will evolve with time and you will not always be in such immediate or raw pain. Be assured that you may never forget your pregnancy or your baby. Healing takes time. I have compiled a list of books on grief and death that might be useful.  *Anna: A Daughter's Life. Trinna Post, [employer], 6962. A father's account of his grief at the loss of his infant daughter. *Explaining Death to Children. Freddi Che, [address], 9528. A valuable guide to help adults/parents explain death to children. *When Pregnancy Fails: Families Coping with Miscarriage, Ectopic Pregnancy, Stillbirth, and Infant Death. Jodie Echevaria and Marijean Heath, [employer], 4132. A good resource to understand pregnancy loss, including some information on support for the family. *Hour of Gold, Hour of Lead: Diaries and Letters of Gaspar Cola. [address] [place], 4401. Cheri Fowler writes of the pain and loss in the wake of their young child's abduction. *A Child Dies: A Portrait of Family Grief. Clydene Laming and Penelope [address] [place], [place], 0272. Dealing with death of unborn children, infants, and children of all ages. Revised by Davonna Belling. Lennox Pippins, MS November, 2008    COPING WITH A MISCARRIAGE Newman Nip, R.N., M.A.T.  You have had a miscarriage. You have lost a pregnancy. This is undoubtedly a difficult and sad time for you. Your loss may be hard to believe and it is frustrating to know you are helpless to change anything. "This can't possibly be happening  to me. Why me? Why not someone else" I promise I'll be more  careful if I can only have my baby back." These are common reactions to experiencing a miscarriage. Feelings of disbelief and anger, helplessness and guilt, depression and perhaps failure, are real and understandable. You may tell yourself that the guilt you feel is irrational, unreasonable or inappropriate, but it is there. "What did I do to cause this? Why wasn't I more careful? If only I had known. Is this a punishment for something I did?" You may find yourself recounting the days or weeks before your miscarriage, searching for clues that you feel sure you must have missed; searching for valid reasons for how or why this happened. Having something "taken away" that you cherish feels shocking and unbelievable. You ask your doctor or midwife for an explanation; friends volunteer their opinions. In many cases, your questions of "how" or "why" are not satisfactorily answered. You may have some of the above thoughts or feelings and they may be present in different combinations, differing degrees, and in some confusion. They are understandable and part of the process of beginning to cope with a significant loss. With any death, it is healthy and essential to allow yourself to experience grief whether you are a man or woman. The grieving process is not something begun and completed in a day, a week or a month. It takes time, understanding, support and acceptance. In the case of a miscarriage, this process is often more difficult because the death is not publicly acknowledged. There is no funeral to arrange and attend, no outward recognition of a loss. This may tend to increase feelings of isolation and loneliness. The term miscarriage is used here instead of the more medically correct term spontaneous abortion to avoid confusion with elective abortion. Some couples feel they must keep their grief invisible and "get over this quickly." Most employers will freely give time off to attend a family  funeral, but many do not understand the reasonable and justified request for time off after a miscarriage. Loss of a longed-for pregnancy, regardless of how early, is a significant bereavement. Men and women may react to the miscarriage differently. Men may feel more helpless and frustrated than their wives because they often assume a passive-observer role. Waiting and watching can be harder than actively participating, regardless of the outcome. Men do experience a miscarriage in spite of not actively participating in the physical loss. People have different ways of coping with a loss and you need to choose what is most helpful to you. Too often "being strong" is looked upon as a healthy way of coping, when, in fact, repressing and ignoring very real feelings is not helpful and contributes to serious coping problems. Some feelings and questions that men and women express and ask are:  It hangs over me like a black cloud. Though I've tried, I honestly don't know how to work this through. How do I say goodbye?  I feel extremely guilty. I guess I really didn't want this pregnancy. I feel relieved that it's over, but so guilty because I'm relieved. This isn't right.  I'm fine. Life must go on and I'm a strong person. There's really nothing to grieve for anyway, is there?  I feel pressure not to be sad. So I tend to cover up a lot.  How do I respond to well meaning but frustrating platitudes like, "Try again soon, dear. Another pregnancy will help." I would like to be pregnant again,  but I believe a pregnancy should happen at a time of strength, not sadness.  How do I respond to silence and awkward attempts to avoid the whole issue? (You may be the one that decides to bring up the topic. Friends, relatives and acquaintances often do not know how to respond, so they say nothing).  I'm fine until my period comes. It is such a start reminder of the part of me that is lost.  My husband/wife and I  always enjoyed a good sex life. Since my miscarriage, I have a hard time allowing myself to feel loving and sensuous. After all if such a loving and pleasurable experience as intercourse started something that ended in such a tragedy, it's too scary to think it might happen again. Why doesn't my husband/wife understand?  I have to face reality. Should I just force myself to attend my friend's baby shower?  Everyone was so supportive and caring when I was in the hospital. That was four months ago. I can't burden others with my sadness now. Why am I still sad? I should be over this thing by now. Knowing common facts about miscarriage probably will not blot out your negative feelings - denial, anger, disappointment, sadness - but facts can hold some comfort and can help cushion the sadness and guilt. For those wanting a child, it is frustrating and intensely disappointing to experience any miscarriage. However, most pregnancies that end in the first three months are imperfect in some way and, regardless of any precaution or intervention, are incapable of surviving and growing beyond approximately 14 weeks. This fact may not ease your sense of emptiness and sadness, but it may help ease your guilt and sense of assumed responsibility. Knowing clearly that you did not cause this to happen, that you are not directly responsible of your miscarriage by what you did or did not do, can bring a sense of relief and relaxation to your anxieties or guilt. A pregnancy that ends in later months is uniquely difficult also. Your protruding abdomen, your child's movements and heartbeat, are concrete proof of your expectant parenthood. A miscarriage occurring at 6 weeks or at 6 months causes a sudden change of events and feelings. Understandably, you may feel robbed or cheated of a promise of what was to be. It's easy to think that everyone else can have children. Surely I've been tricked or badly treated, you  think. A fact not commonly know is that at least one in five pregnancies end in miscarriage, which indicates you are not alone. In a room of 25 couples, the odds are that 4 have experienced a miscarriage. Of these 8 people, many have shared your experience and feelings. Of course, this is not an easy topic to talk about, therefore it can appear that miscarriages rarely happen and that you are the only one coping with this loss. Realizing you are note alone can perhaps ease some of the shock and disbelief of "WHY Me?"  Three actions will help: 1. Giver yourself permission to be sad. It is possible to be sad without becoming chronically depressed. Set limits for yourself if you are concerned about becoming overly depressed. If the sadness begins to wash over you while at work at 2 p.m., tell yourself that you cannot deal with it then, but make an agreement with yourself that at 8 p.m. that night you will. And then do it. Many individuals report a beginning sense of control over their grief when they realize they  can create appropriate and private times to be sad, alone, or with a significant other person. If you think you may be severely depressed (i.e. having difficulty getting out of bed in the morning, withdrawal from friends and relatives, insomnia, extended loss of appetite or overeating, loss of the ability to enjoy anything), this problem is probably not something you can handle yourself. It is important to seek out professional help at this time, or at any time when you are feeling particularly discouraged about your Coping.  2. Find a supportive, trusted person and talk about your feelings and your questions. Build a support system, whether it be your spouse, partner, friend, relative, colleague, clergyman, support group or Pharmacist, hospital. You may still feel some pangs of sadness when attending a baby shower, celebrating a holiday, seeing other pregnant couples or passing  the date of your expected delivery, but once you work through your loss, coping with your situation will become easier.  3. Also part of the process of life itself, is understanding and accepting incongruent or seemingly contradictory feelings. As one insightful women expressed, "Being happy for a friend who has just had a baby (or twins!) is a genuine feeling, but also resentment, anger and frustration are there, all wrapped into one package;" two apparently conflicting feelings, but both can exist, be recognized, and accepted.  A miscarriage is an unexpected, often unexplained, death; with any loss you need to give yourself permission to grieve, to be angry, sad or relieved. There is no one appropriate reaction. Acknowledging your feelings that are real and understandable, though they may not be logical or rational, is part of the grieving process. Allowing the grieving process to happen is a positive way of coping and leads to health resolve and strength to look ahead. There may be times when you may not be able to put words to your feelings, but that does not need to stop you from seeking out and talking with an understanding person. Building your own support system will help you regain confidence and strength within yourself.  RECOMMENDED READING The following materials are all available through [employer], [address] [place], [place] [address]; phone [phone]. For Adults: Empty Arms by Peri Maris Empty Cradle, Broken Heart by Doyne Keel Ended Beginnings by C. Panuthos & C. Romeo Miscarriage a Probation officer. Resource Miscarriage - A Shattered Dream by Peri Maris Newborn Death a Probation officer. Resource Still To Be Born by [employer] When a Doctor, hospital by [employer]. Church et al (TCF) When [employer] Goodbye by P. Schwiebert and Mort Sawyers When Pregnancy Fails by Kathie Rhodes Borg & Edwyna Ready For Helping Other Children How Do We Tell the Children? by Shaefer &  Nunzio Cory No New Baby by Val Eagle Talking About Death by Freddi Che Where's Jess?? A [employer]. Resource Other helpful materials available locally at [employer]: The Fall of Freddy the Leaf by Reggie Pile, PhD, [employer]. (for children) When Filbert Berthold is Forever - Learning to Live Again After the Loss of a Child by Carlyon Prows, [employer]     Miscarriage A miscarriage is the loss of an unborn baby (fetus) before the 20th week of pregnancy. Most miscarriages happen during the first 3 months of pregnancy. Sometimes, a miscarriage can happen before a woman knows that she is pregnant. Having a miscarriage can be an emotional experience. If you have had a miscarriage, talk with your health care provider about any questions you may have about miscarrying, the grieving process, and  your plans for future pregnancy. What are the causes? A miscarriage may be caused by:  Problems with the genes or chromosomes of the fetus. These problems make it impossible for the baby to develop normally. They are often the result of random errors that occur early in the development of the baby, and are not passed from parent to child (not inherited).  Infection of the cervix or uterus.  Conditions that affect hormone balance in the body.  Problems with the cervix, such as the cervix opening and thinning before pregnancy is at term (cervical insufficiency).  Problems with the uterus. These may include: ? A uterus with an abnormal shape. ? Fibroids in the uterus. ? Congenital abnormalities. These are problems that were present at birth.  Certain medical conditions.  Smoking, drinking alcohol, or using drugs.  Injury (trauma). In many cases, the cause of a miscarriage is not known. What are the signs or symptoms? Symptoms of this condition include:  Vaginal bleeding or spotting, with or without cramps or pain.  Pain or cramping in the abdomen or lower back.  Passing fluid, tissue, or  blood clots from the vagina. How is this diagnosed? This condition may be diagnosed based on:  A physical exam.  Ultrasound.  Blood tests.  Urine tests. How is this treated? Treatment for a miscarriage is sometimes not necessary if you naturally pass all the tissue that was in your uterus. If necessary, this condition may be treated with:  Dilation and curettage (D&C). This is a procedure in which the cervix is stretched open and the lining of the uterus (endometrium) is scraped. This is done only if tissue from the fetus or placenta remains in the body (incomplete miscarriage).  Medicines, such as: ? Antibiotic medicine, to treat infection. ? Medicine to help the body pass any remaining tissue. ? Medicine to reduce (contract) the size of the uterus. These medicines may be given if you have a lot of bleeding. If you have Rh negative blood and your baby was Rh positive, you will need a shot of a medicine called Rh immunoglobulinto protect your future babies from Rh blood problems. "Rh-negative" and "Rh-positive" refer to whether or not the blood has a specific protein found on the surface of red blood cells (Rh factor). Follow these instructions at home: Medicines   Take over-the-counter and prescription medicines only as told by your health care provider.  If you were prescribed antibiotic medicine, take it as told by your health care provider. Do not stop taking the antibiotic even if you start to feel better.  Do not take NSAIDs, such as aspirin and ibuprofen, unless they are approved by your health care provider. These medicines can cause bleeding. Activity  Rest as directed. Ask your health care provider what activities are safe for you.  Have someone help with home and family responsibilities during this time. General instructions  Keep track of the number of sanitary pads you use each day and how soaked (saturated) they are. Write down this information.  Monitor the  amount of tissue or blood clots that you pass from your vagina. Save any large amounts of tissue for your health care provider to examine.  Do not use tampons, douche, or have sex until your health care provider approves.  To help you and your partner with the process of grieving, talk with your health care provider or seek counseling.  When you are ready, meet with your health care provider to discuss any important steps you  should take for your health. Also, discuss steps you should take to have a healthy pregnancy in the future.  Keep all follow-up visits as told by your health care provider. This is important. Where to find more information  The American Congress of Obstetricians and Gynecologists: www.acog.org  U.S. Department of Health and Cytogeneticist of Women's Health: http://hoffman.com/ Contact a health care provider if:  You have a fever or chills.  You have a foul smelling vaginal discharge.  You have more bleeding instead of less. Get help right away if:  You have severe cramps or pain in your back or abdomen.  You pass blood clots or tissue from your vagina that is walnut-sized or larger.  You soak more than 1 regular sanitary pad in an hour.  You become light-headed or weak.  You pass out.  You have feelings of sadness that take over your thoughts, or you have thoughts of hurting yourself. Summary  Most miscarriages happen in the first 3 months of pregnancy. Sometimes miscarriage happens before a woman even knows that she is pregnant.  Follow your health care provider's instruction for home care. Keep all follow-up appointments.  To help you and your partner with the process of grieving, talk with your health care provider or seek counseling. This information is not intended to replace advice given to you by your health care provider. Make sure you discuss any questions you have with your health care provider. Document Revised: 08/21/2018 Document  Reviewed: 06/04/2016 Elsevier Patient Education  2020 [employer].

## 2020-04-14 NOTE — Addendum Note (Signed)
Addended by: Clement Husbands A on: 04/14/2020 03:27 PM   Modules accepted: Orders
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
## 2020-05-09 ENCOUNTER — Ambulatory Visit: Payer: BC Managed Care – PPO | Admitting: Obstetrics and Gynecology

## 2020-05-09 ENCOUNTER — Ambulatory Visit: Payer: BC Managed Care – PPO

## 2021-04-16 NOTE — Telephone Encounter (Signed)
Mirena rcvd 01/15/2019

## 2021-11-23 ENCOUNTER — Encounter (HOSPITAL_COMMUNITY): Payer: Self-pay

## 2021-11-23 ENCOUNTER — Emergency Department (HOSPITAL_COMMUNITY)
Admission: EM | Admit: 2021-11-23 | Discharge: 2021-11-24 | Disposition: A | Discharge status: Home / Self Care | Payer: BC Managed Care – PPO | Attending: Student | Admitting: Student

## 2021-11-23 ENCOUNTER — Emergency Department (HOSPITAL_COMMUNITY): Payer: BC Managed Care – PPO

## 2021-11-23 ENCOUNTER — Other Ambulatory Visit: Payer: Self-pay

## 2021-11-23 DIAGNOSIS — M542 Cervicalgia: Secondary | ICD-10-CM

## 2021-11-23 DIAGNOSIS — M545 Low back pain, unspecified: Secondary | ICD-10-CM | POA: Diagnosis not present

## 2021-11-23 DIAGNOSIS — Y92410 Unspecified street and highway as the place of occurrence of the external cause: Secondary | ICD-10-CM | POA: Diagnosis not present

## 2021-11-23 DIAGNOSIS — M25552 Pain in left hip: Secondary | ICD-10-CM | POA: Diagnosis not present

## 2021-11-23 LAB — PREGNANCY, URINE: Preg Test, Ur: NEGATIVE

## 2021-11-23 MED ORDER — METHOCARBAMOL 500 MG PO TABS: 500.0000 mg | ORAL_TABLET | Freq: Two times a day (BID) | ORAL | 0 refills | Status: AC

## 2021-11-23 MED ORDER — IBUPROFEN 600 MG PO TABS: 600.0000 mg | ORAL_TABLET | Freq: Four times a day (QID) | ORAL | 0 refills | Status: AC | PRN

## 2021-11-23 MED ORDER — IBUPROFEN 400 MG PO TABS
600.0000 mg | ORAL_TABLET | Freq: Once | ORAL | Status: AC
  Administered 2021-11-23: 600 mg via ORAL
  Filled 2021-11-23: qty 2

## 2021-11-23 NOTE — ED Notes (Signed)
Patient c/o left shoulder pain and left-sided neck pain.
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
## 2021-11-23 NOTE — Discharge Instructions (Addendum)
Thanks for following up on the the pain you are experiencing is likely due to muscle strain, you may take Ibuprofen and Robaxin as needed for pain management. Do not combine with any pain reliever other than tylenol.  You may also use ice and heat, and over-the-counter remedies such as Biofreeze gel or salon pas lidocaine patches. The muscle soreness should improve over the next week. Follow up with your family doctor in the next week for a recheck if you are still having symptoms. Return to ED if pain is worsening, you develop weakness or numbness of extremities, or new or concerning symptoms develop.

## 2022-05-21 IMAGING — DX DG ANKLE COMPLETE 3+V*R*
4 series · 4 of 4 positions shown · non-contrast
Comparison: None.

CLINICAL DATA: Right ankle pain

EXAM:
RIGHT ANKLE - COMPLETE 3+ VIEW

[ankle ap]
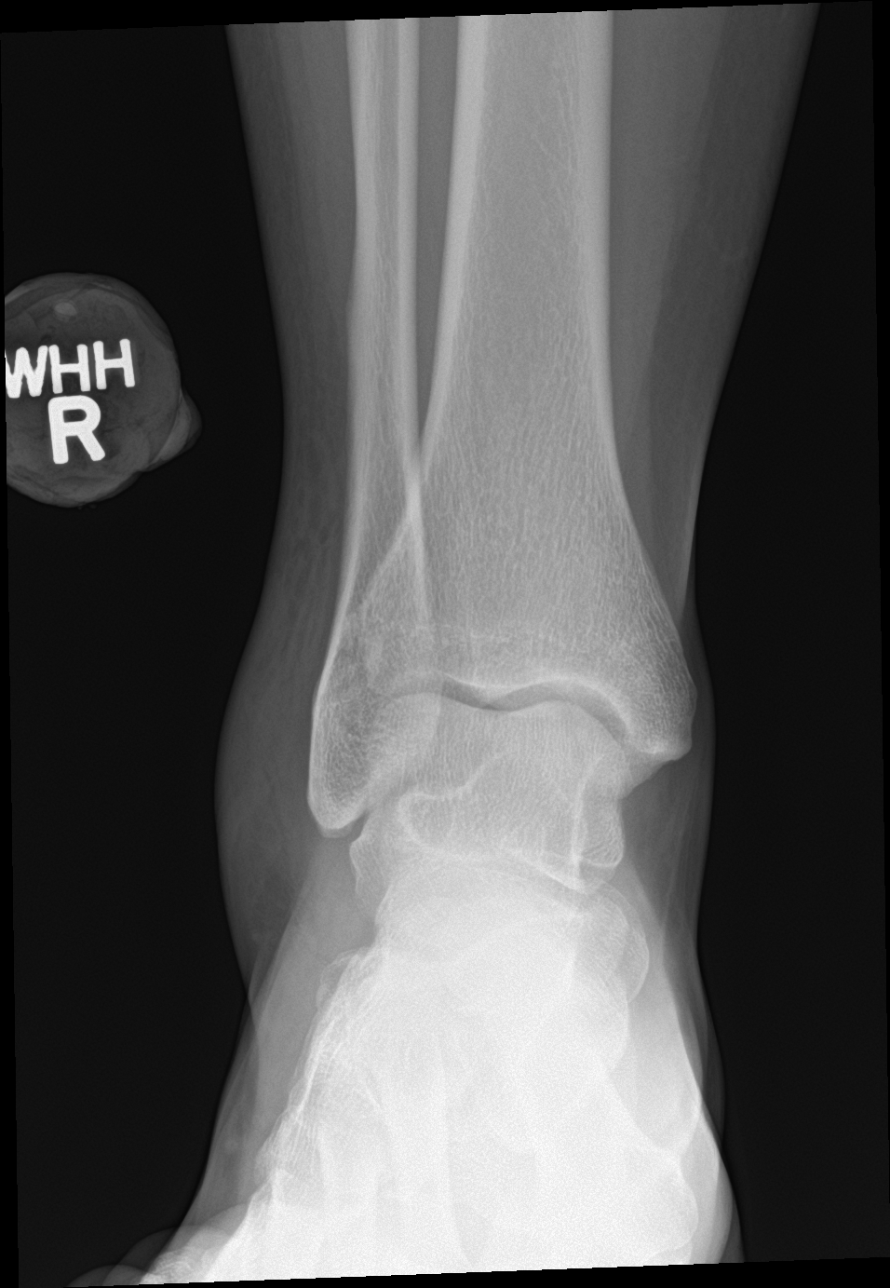

[ankle obl (1 of 2)]
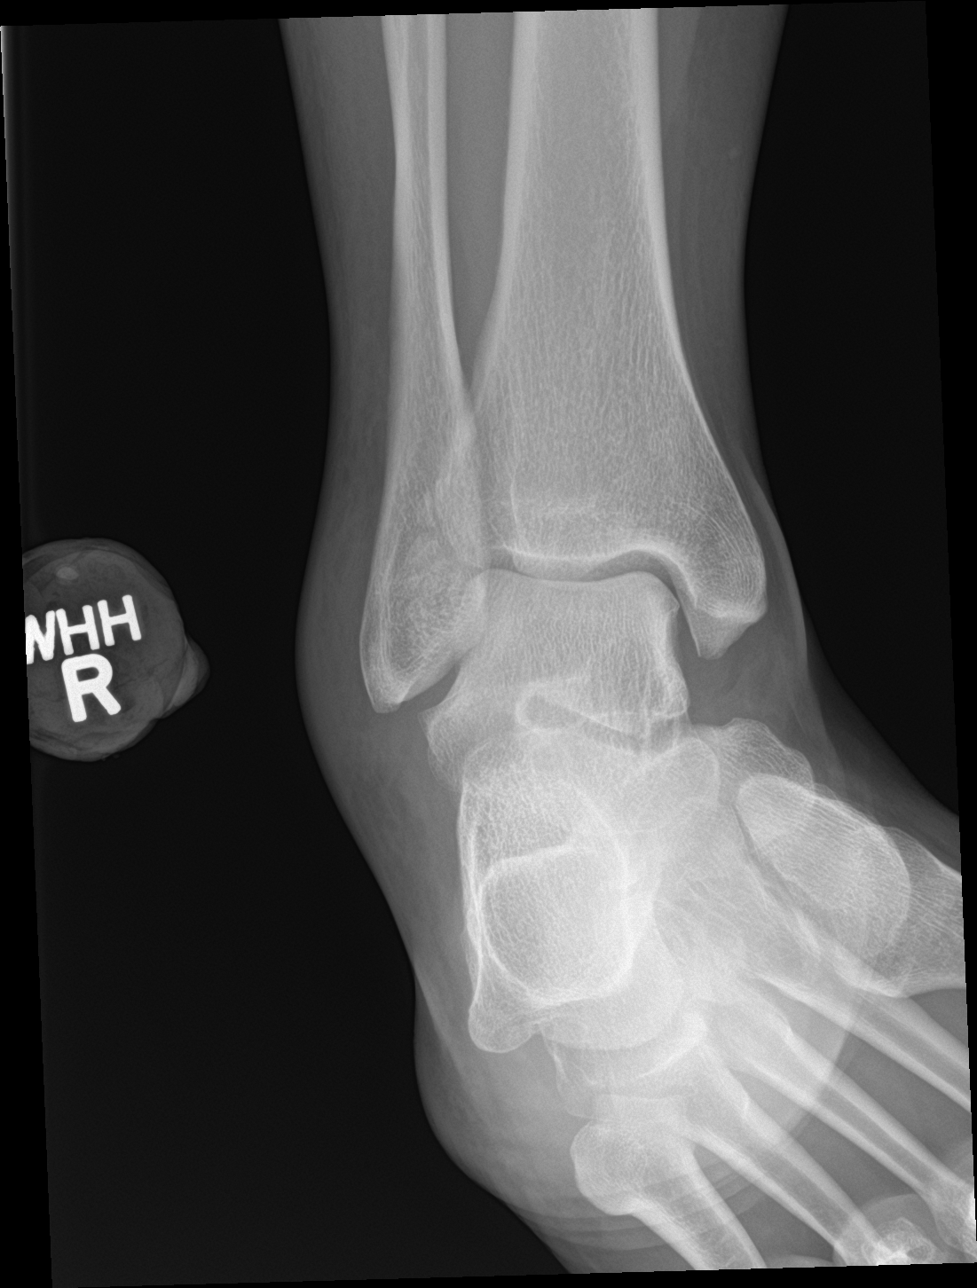

[ankle lat]
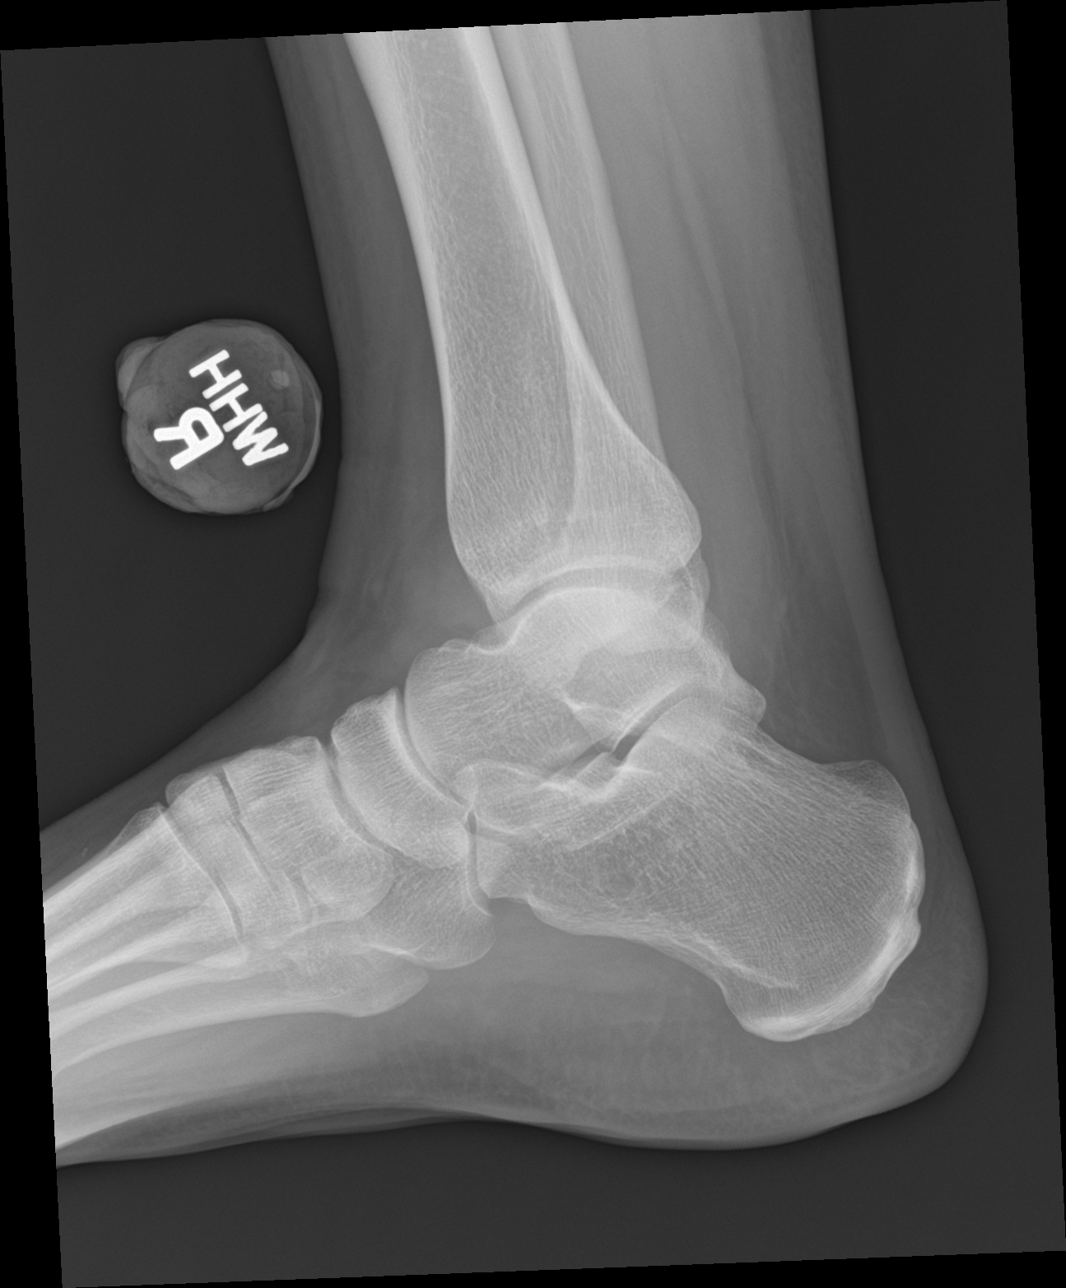

[ankle obl (2 of 2)]
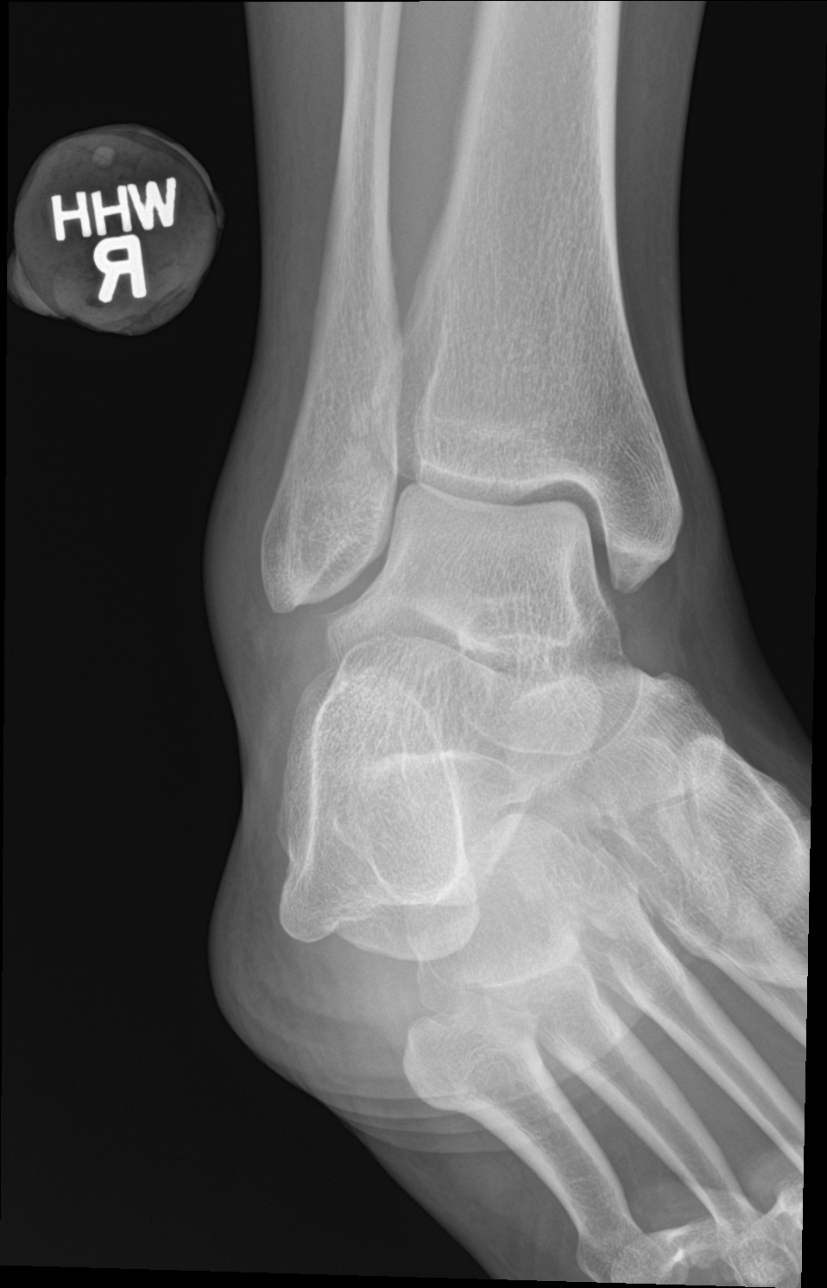

[4 of 4 positions shown; findings below may reference images not displayed]

FINDINGS: Three view radiograph right ankle demonstrates normal alignment. No
fracture or dislocation. Ankle mortise is intact. Marked soft tissue
swelling is seen superficial to the lateral malleolus. Small ankle
effusion is present.
IMPRESSION: Soft tissue swelling. Right ankle effusion. No fracture or
dislocation.
